# Patient Record
Sex: Female | Born: 2000 | Hispanic: Yes | Marital: Single
Health system: Southern US, Community
[De-identification: ages and names within clinical notes are randomized; demographics above are authoritative.]

---
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
## 2022-01-27 ENCOUNTER — Ambulatory Visit: Payer: Medicaid Other | Attending: Nurse Practitioner | Admitting: Physical Therapy

## 2022-01-27 ENCOUNTER — Other Ambulatory Visit: Payer: Self-pay

## 2022-01-27 DIAGNOSIS — M5442 Lumbago with sciatica, left side: Secondary | ICD-10-CM | POA: Diagnosis present

## 2022-01-27 DIAGNOSIS — G8929 Other chronic pain: Secondary | ICD-10-CM | POA: Diagnosis present

## 2022-01-27 DIAGNOSIS — R262 Difficulty in walking, not elsewhere classified: Secondary | ICD-10-CM

## 2022-01-27 DIAGNOSIS — M79605 Pain in left leg: Secondary | ICD-10-CM
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
## 2022-01-28 ENCOUNTER — Encounter: Payer: Self-pay | Admitting: Physical Therapy

## 2022-02-03 ENCOUNTER — Ambulatory Visit: Payer: Medicaid Other | Admitting: Physical Therapy

## 2022-02-03 ENCOUNTER — Encounter: Payer: Medicaid Other | Admitting: Physical Therapy

## 2022-02-10 ENCOUNTER — Ambulatory Visit: Payer: Medicaid Other | Admitting: Physical Therapy

## 2022-02-10 ENCOUNTER — Encounter: Payer: Self-pay | Admitting: Physical Therapy

## 2022-02-10 DIAGNOSIS — G8929 Other chronic pain: Secondary | ICD-10-CM

## 2022-02-10 DIAGNOSIS — R262 Difficulty in walking, not elsewhere classified: Secondary | ICD-10-CM

## 2022-02-10 DIAGNOSIS — M5442 Lumbago with sciatica, left side: Secondary | ICD-10-CM | POA: Diagnosis not present

## 2022-02-10 DIAGNOSIS — M79605 Pain in left leg: Secondary | ICD-10-CM

## 2022-02-10 NOTE — Therapy (Signed)
?OUTPATIENT PHYSICAL THERAPY TREATMENT NOTE ? ? ?Patient Name: Miranda Watson ?MRN: [id number] ?DOB:02/08/2001, 21 y.o., female ?Today's Date: 02/10/2022 ? ?[facility] ?REFERRING PROVIDER: Sheilah MinsBermudez, Karina E, NP ? ? PT End of Session - 02/10/22 1620   ? ? Visit Number 2   ? Number of Visits 24   ? Date for PT Re-Evaluation 04/21/22   ? Authorization Type self pay reporting period from 01/27/2022   ? Progress Note Due on Visit 10   ? PT Start Time 1617   ? PT Stop Time 1645   ? PT Time Calculation (min) 28 min   ? Activity Tolerance Patient tolerated treatment well   ? [facility] assessed/performed   ? ?  ?  ? ?  ? ? ?History reviewed. No pertinent past medical history. ?History reviewed. No pertinent surgical history. ?There are no problems to display for this patient. ? ? ?REFERRING DIAG: Posterior thigh pain, left ? ?THERAPY DIAG:  ?Chronic left-sided low back pain with left-sided sciatica ? ?Pain in left leg ? ?Difficulty in walking, not elsewhere classified ? ?PERTINENT HISTORY: Patient is a 21 y.o. female who presents to outpatient physical therapy with a referral for medical diagnosis left posterior thigh pain. This patient's chief complaints consist of chronic intermittent pain in the left posterior thigh that radiates at times to the proximal left calf and up to the left low back leading to the following functional deficits: difficulty performing her usual activities as she did prior to onset of condition, cannot touch toes any longer, cannot lift leg farther than a certain angle, has difficulty with stretching, she cannot "do anything," she has trouble with everyday life, walking, sitting down, standing, hanging out with friends. .  Relevant past medical history and comorbidities include low on vitamin D.  Patient denies history of lumbar surgery ? ?PRECAUTIONS: none ? ?SUBJECTIVE: Patient states she is feeling well today and she can tell the stretches from  her HEP are helping some. She states her pain is not bad today. She rates it 2/10 in the familiar location of the posterior left thigh.  ? ?PAIN:  ?Are you having pain? Yes. 2/10 familiar pain in the posterior left thigh.  ? ? ?TODAY'S TREATMENT:  ?Therapeutic exercise: to centralize symptoms and improve ROM, strength, muscular endurance, and activity tolerance required for successful completion of functional activities.  ?- NuStep level 3 using bilateral upper and lower extremities. Seat/handle setting 7/7. For improved extremity mobility, muscular endurance, and activity tolerance; and to induce the analgesic effect of aerobic exercise, stimulate improved joint nutrition, and prepare body structures and systems for following interventions. x 4:40  minutes. Average SPM = 73. Discontinued due to pain increasing to 4-5/10. ?- seated lumbar flexion 2x10 (resolved pain) ?- hooklying posterior pelvic tilt, 1x20 (good carry over)  ?- hooklying posterior pelvic tilt with marching, 1x20 each side  ?- hooklying posterior pelvic tilt with pallof press held against red theraband, 1x20 each side.  ?- seated sciatic nerve glide (slider technique), L LE ?- seated lumbar flexion with self-overpressure , 1x10 ?- Education on HEP including handout  ?  ?  ?Pt required multimodal cuing for proper technique and to facilitate improved neuromuscular control, strength, range of motion, and functional ability resulting in improved performance and form. ?  ? ? ? ?PATIENT EDUCATION: ?Education details: Exercise purpose/form. Self management techniques. ?Person educated: Patient ?Education method: Explanation, Demonstration, Tactile cues, and Verbal cues ?Education comprehension: verbalized understanding, returned  demonstration, verbal cues required, tactile cues required, and needs further education ? ? ?HOME EXERCISE PROGRAM: ?Access Code: [email or web address] ?URL: [email or web address] ?Date: 02/10/2022 ?Prepared by: Norton Blizzard ? ?Exercises ?- Hooklying Anti-Rotation Press With Anchored Resistance  - 1 x daily - 3-5 x weekly - 3 sets - 20 reps ?- Supine Double Knee to Chest  - 2 x daily - 3-5 x weekly - 2 sets - 10 reps - 2 seconds hold ?- Seated Lumbar Flexion Stretch  - 2 x daily - 3-5 x weekly - 2 sets - 10 reps - 2 seconds hold ?- Seated Slump Nerve Glide  - 2 x daily - 1 sets - 15 reps ? ? PT Short Term Goals   ? ?  ? PT SHORT TERM GOAL #1  ? Title Be independent with initial home exercise program for self-management of symptoms.   ? Baseline Initial HEP provided at visit 2 (01/27/2022);   ? Time 2   ? Period Weeks   ? Status Achieved   ? Target Date 02/11/22   ? ?  ?  ? ?  ? ? ? PT Long Term Goals   ?TARGET DATE FOR ALL LONG TERM GOALS: 04/21/2022 ?  ? PT LONG TERM GOAL #1  ? Title Be independent with a long-term home exercise program for self-management of symptoms   ? Baseline Initial HEP provided at IE (01/27/2022);   ? Time 12   ? Period Weeks   ? Status progressing  ?  ? PT LONG TERM GOAL #2  ? Title Reduce pain with functional activities to equal or less than 1/10 to allow patient to complete usual activities including schoolwork, sitting down, walking with less difficulty.   ? Baseline up to 7/10 (01/27/2022);   ? Time 12   ? Period Weeks   ? Status progressing  ?  ? PT LONG TERM GOAL #3  ? Title Demonstrate improved FOTO score by 10 units to demonstrate improvement in overall condition and self-reported functional ability.   ? Baseline to be measured visit 2 as appropriate (01/27/2022); unable due to tech malfunction (02/10/2022);   ? Time 12   ? Period Weeks   ? Status On-going  ?  ? PT LONG TERM GOAL #4  ? Title Have full lumbar spine AROM with no compensations or increase in pain in all planes except intermittent end range discomfort improve ability to complete valued  activities with confidence such as stretching, exercising, everyday activities.   ? Baseline limited and painful - see objective exam (01/27/2022);   ? Time  12   ? Period Weeks   ? Status On-going  ?  ? PT LONG TERM GOAL #5  ? Title Complete community, work and/or recreational activities without limitation due to current condition.   ? Baseline difficulty performing her usual activities including touching toes,  lifting L  leg farther than a certain angle, stretching, doing "anything," everyday life, walking, sitting down, standing, hanging out with friends (01/27/2022);   ? Time 12   ? Period Weeks   ? Status On-going  ? ?  ?  ? ?  ? ? ? Plan  ? ? Clinical Impression Statement Patient arrived late to appointment and her session was short due to limited time. She tolerated treatment well with no increase in pain by end of session. She continues to report resolution of pain with repeated lumbar flexion and feels the core exercises help her. Session focused on updating HEP and advancing core  and pain relieving exercises as tolerated. Patient would benefit from continued management of limiting condition by skilled physical therapist to address remaining impairments and functional limitations to work towards stated goals and return to PLOF or maximal functional independence.  ?  ? Personal Factors and Comorbidities Age;Past/Current Experience;Fitness;Time since onset of injury/illness/exacerbation   ? Examination-Activity Limitations Locomotion Level;Stairs;Carry;Bend;Lift;Squat;Sit   ? Examination-Participation Restrictions Interpersonal Relationship;Occupation;Cleaning;Laundry;Community Activity   difficulty performing her usual activities including touching toes,  lifting L  leg farther than a certain angle, stretching, doing "anything," everyday life, walking, sitting down, standing, hanging out with friends  ? Stability/Clinical Decision Making Stable/Uncomplicated   ? Rehab Potential Good   ? PT Frequency 2x / week   ? PT Duration 12 weeks   ? PT Treatment/Interventions ADLs/Self Care Home Management;Aquatic Therapy;Cryotherapy;Electrical Stimulation;Moist Heat;DME  Instruction;Neuromuscular re-education;Therapeutic exercise;Therapeutic activities;Patient/family education;Manual techniques;Dry needling;Passive range of motion;Spinal Manipulations;Joint Manipulations   ? PT Next

## 2022-02-16 ENCOUNTER — Ambulatory Visit: Payer: Medicaid Other | Admitting: Physical Therapy

## 2022-02-18 ENCOUNTER — Encounter: Payer: Self-pay | Admitting: Physical Therapy

## 2022-02-18 ENCOUNTER — Ambulatory Visit: Payer: Medicaid Other | Attending: Nurse Practitioner | Admitting: Physical Therapy

## 2022-02-18 DIAGNOSIS — M79605 Pain in left leg: Secondary | ICD-10-CM | POA: Insufficient documentation

## 2022-02-18 DIAGNOSIS — M5442 Lumbago with sciatica, left side: Secondary | ICD-10-CM | POA: Insufficient documentation

## 2022-02-18 DIAGNOSIS — G8929 Other chronic pain: Secondary | ICD-10-CM | POA: Diagnosis present

## 2022-02-18 DIAGNOSIS — R262 Difficulty in walking, not elsewhere classified: Secondary | ICD-10-CM | POA: Diagnosis present

## 2022-02-18 DIAGNOSIS — M5459 Other low back pain: Secondary | ICD-10-CM | POA: Diagnosis present

## 2022-02-18 NOTE — Therapy (Signed)
?OUTPATIENT PHYSICAL THERAPY TREATMENT NOTE ? ? ?Patient Name: Miranda Watson ?MRN: [id number] ?DOB:February 06, 2001, 21 y.o., female ?Today's Date: 02/18/2022 ? ?PCP: [facility] ?REFERRING PROVIDER: Sheilah Mins, NP ? ? PT End of Session - 02/18/22 1851   ? ? Visit Number 3   ? Number of Visits 24   ? Date for PT Re-Evaluation 04/21/22   ? Authorization Type [place] [employer]ICA[employer] [place] reporting period from 01/27/2022   ? Authorization Time Period Amerihealthauth#[id number] 3/15-6/7 24 PT visits   ? Authorization - Visit Number 3   ? Authorization - Number of Visits 24   ? Progress Note Due on Visit 10   ? PT Start Time 1815   ? PT Stop Time 1855   ? PT Time Calculation (min) 40 min   ? Activity Tolerance Patient tolerated treatment well   ? Behavior During Therapy [facility] for tasks assessed/performed   ? ?  ?  ? ?  ? ? ? ?History reviewed. No pertinent past medical history. ?History reviewed. No pertinent surgical history. ?There are no problems to display for this patient. ? ? ?REFERRING DIAG: Posterior thigh pain, left ? ?THERAPY DIAG:  ?Chronic left-sided low back pain with left-sided sciatica ? ?Pain in left leg ? ?Difficulty in walking, not elsewhere classified ? ?PERTINENT HISTORY: Patient is a 21 y.o. female who presents to outpatient physical therapy with a referral for medical diagnosis left posterior thigh pain. This patient's chief complaints consist of chronic intermittent pain in the left posterior thigh that radiates at times to the proximal left calf and up to the left low back leading to the following functional deficits: difficulty performing her usual activities as she did prior to onset of condition, cannot touch toes any longer, cannot lift leg farther than a certain angle, has difficulty with stretching, she cannot "do anything," she has trouble with everyday life, walking, sitting down, standing, hanging out with friends. .  Relevant past medical history and  comorbidities include low on vitamin D.  Patient denies history of lumbar surgery ? ?PRECAUTIONS: none ? ?SUBJECTIVE: Patient reports she is feeling well and her leg pain has been better. She states she has days now where she doesn't have leg pain at all. She states today the pain has been off and on but currently has no pain. She states she has had a hard time getting her HEP done because she has been busy.  ? ?PAIN:  ?Are you having pain? No.  ? ?OBJECTIVE ? ?SELF-REPORTED FUNCTION ?FOTO score: 44/100 (lumbar questionnaire) ? ?TODAY'S TREATMENT:  ?Therapeutic exercise: to centralize symptoms and improve ROM, strength, muscular endurance, and activity tolerance required for successful completion of functional activities.  ?- NuStep level 4 using bilateral upper and lower extremities. Seat/handle setting 7/7. For improved extremity mobility, muscular endurance, and activity tolerance; and to induce the analgesic effect of aerobic exercise, stimulate improved joint nutrition, and prepare body structures and systems for following interventions. x 5:48  minutes. Average SPM = 77. ?- seated lumbar flexion 2x10 with self overpressure ?- hooklying posterior pelvic tilt with marches while holding pallof press held against green theraband, 1x20 each side.  ?- hooklying posterior pelvic tilt with alternating LE extension while holding pallof press held against green theraband, 2x1 min each side.  ?- hooklying articulated bridge, 3x10  ?- seated sciatic nerve glide (slider technique), 1x15 L LE ?- seated hamstring curl on OMEGA machine, 3x10 at 20# ?- seated lumbar flexion with self-overpressure, 2x10 ? ?  Pt required multimodal cuing for proper technique and to facilitate improved neuromuscular control, strength, range of motion, and functional ability resulting in improved performance and form. ?  ? ?PATIENT EDUCATION: ?Education details: Exercise purpose/form. Self management techniques. ?Person educated: Patient ?Education  method: Explanation, Demonstration, Tactile cues, and Verbal cues ?Education comprehension: verbalized understanding, returned demonstration, verbal cues required, tactile cues required, and needs further education ? ? ?HOME EXERCISE PROGRAM: ?Access Code: [email or web address] ?URL: [email or web address] ?Date: 02/10/2022 ?Prepared by: Norton BlizzardSara Tiler Brandis ? ?Exercises ?- Hooklying Anti-Rotation Press With Anchored Resistance  - 1 x daily - 3-5 x weekly - 3 sets - 20 reps ?- Supine Double Knee to Chest  - 2 x daily - 3-5 x weekly - 2 sets - 10 reps - 2 seconds hold ?- Seated Lumbar Flexion Stretch  - 2 x daily - 3-5 x weekly - 2 sets - 10 reps - 2 seconds hold ?- Seated Slump Nerve Glide  - 2 x daily - 1 sets - 15 reps ? ? PT Short Term Goals   ? ?  ? PT SHORT TERM GOAL #1  ? Title Be independent with initial home exercise program for self-management of symptoms.   ? Baseline Initial HEP provided at visit 2 (01/27/2022);   ? Time 2   ? Period Weeks   ? Status Achieved   ? Target Date 02/11/22   ? ?  ?  ? ?  ? ? ? PT Long Term Goals   ?TARGET DATE FOR ALL LONG TERM GOALS: 04/21/2022 ?  ? PT LONG TERM GOAL #1  ? Title Be independent with a long-term home exercise program for self-management of symptoms   ? Baseline Initial HEP provided at IE (01/27/2022);   ? Time 12   ? Period Weeks   ? Status progressing  ?  ? PT LONG TERM GOAL #2  ? Title Reduce pain with functional activities to equal or less than 1/10 to allow patient to complete usual activities including schoolwork, sitting down, walking with less difficulty.   ? Baseline up to 7/10 (01/27/2022);   ? Time 12   ? Period Weeks   ? Status progressing  ?  ? PT LONG TERM GOAL #3  ? Title Demonstrate improved FOTO score by 10 units to demonstrate improvement in overall condition and self-reported functional ability.   ? Baseline to be measured visit 2 as appropriate (01/27/2022); 44 at visit#2 (02/10/2022);   ? Time 12   ? Period Weeks   ? Status On-going  ?  ? PT LONG TERM GOAL  #4  ? Title Have full lumbar spine AROM with no compensations or increase in pain in all planes except intermittent end range discomfort improve ability to complete valued  activities with confidence such as stretching, exercising, everyday activities.   ? Baseline limited and painful - see objective exam (01/27/2022);   ? Time 12   ? Period Weeks   ? Status On-going  ?  ? PT LONG TERM GOAL #5  ? Title Complete community, work and/or recreational activities without limitation due to current condition.   ? Baseline difficulty performing her usual activities including touching toes,  lifting L  leg farther than a certain angle, stretching, doing "anything," everyday life, walking, sitting down, standing, hanging out with friends (01/27/2022);   ? Time 12   ? Period Weeks   ? Status On-going  ? ?  ?  ? ?  ? ? ? Plan  ? ? Clinical Impression Statement  Patient tolerated treatment well with no increase in symptoms by end of session. Continued working on flexion based core strengthening and specific exercise for flexion preference. Plan to continue to progress exercises to work towards more functional positions as appropriate next session. Patient would benefit from continued management of limiting condition by skilled physical therapist to address remaining impairments and functional limitations to work towards stated goals and return to PLOF or maximal functional independence.   ? Personal Factors and Comorbidities Age;Past/Current Experience;Fitness;Time since onset of injury/illness/exacerbation   ? Examination-Activity Limitations Locomotion Level;Stairs;Carry;Bend;Lift;Squat;Sit   ? Examination-Participation Restrictions Interpersonal Relationship;Occupation;Cleaning;Laundry;Community Activity   difficulty performing her usual activities including touching toes,  lifting L  leg farther than a certain angle, stretching, doing "anything," everyday life, walking, sitting down, standing, hanging out with friends  ?  Stability/Clinical Decision Making Stable/Uncomplicated   ? Rehab Potential Good   ? PT Frequency 2x / week   ? PT Duration 12 weeks   ? PT Treatment/Interventions ADLs/Self Care Home Management;Aquatic Therapy;[place]

## 2022-02-23 ENCOUNTER — Ambulatory Visit: Payer: Medicaid Other | Admitting: Physical Therapy

## 2022-02-24 ENCOUNTER — Telehealth: Payer: Self-pay | Admitting: Physical Therapy

## 2022-02-24 NOTE — Telephone Encounter (Signed)
Called patient after she did not come to her appointment scheduled yesterday at 5:30pm. She answered and was very apologetic when told about the missed appointment, stating she completely forgot. She knew when her next appointment is scheduled (thurs 02/25/22 at 6:15pm) and confirmed she planned to be there.  ? ?Everlean Alstrom. Graylon Good, PT, DPT ?02/24/22, 9:17 AM ? ?[facility] ?[address] ?[place], [place] [address] ?PFC:[id number] I F: [phone] ? ?

## 2022-02-25 ENCOUNTER — Encounter: Payer: Self-pay | Admitting: Physical Therapy

## 2022-02-25 ENCOUNTER — Ambulatory Visit: Payer: Medicaid Other | Admitting: Physical Therapy

## 2022-02-25 DIAGNOSIS — M5442 Lumbago with sciatica, left side: Secondary | ICD-10-CM | POA: Diagnosis not present

## 2022-02-25 DIAGNOSIS — R262 Difficulty in walking, not elsewhere classified: Secondary | ICD-10-CM

## 2022-02-25 DIAGNOSIS — M79605 Pain in left leg: Secondary | ICD-10-CM

## 2022-02-25 DIAGNOSIS — M5459 Other low back pain: Secondary | ICD-10-CM

## 2022-02-25 NOTE — Therapy (Signed)
?OUTPATIENT PHYSICAL THERAPY TREATMENT NOTE ? ? ?Patient Name: Miranda Watson ?MRN: [id number] ?DOB:[birth year], 21 y.o., female ?Today's Date: 02/25/2022 ? ?PCP: [facility] ?REFERRING PROVIDER: Sheilah Mins, NP ? ? PT End of Session - 02/25/22 1827   ? ? Visit Number 4   ? Number of Visits 24   ? Date for PT Re-Evaluation 04/21/22   ? Authorization Type [place] MEDICAID [employer] OF [place] reporting period from 01/27/2022   ? Authorization Time Period Amerihealthauth#[id number] 3/15-6/7 24 PT visits   ? Authorization - Visit Number 4   ? Authorization - Number of Visits 24   ? Progress Note Due on Visit 10   ? PT Start Time 1823   ? PT Stop Time 1901   ? PT Time Calculation (min) 38 min   ? Activity Tolerance Patient tolerated treatment well   ? Behavior During Therapy [facility] for tasks assessed/performed   ? ?  ?  ? ?  ? ? ? ? ?History reviewed. No pertinent past medical history. ?History reviewed. No pertinent surgical history. ?There are no problems to display for this patient. ? ? ?REFERRING DIAG: Posterior thigh pain, left ? ?THERAPY DIAG:  ?Other low back pain ? ?Pain in left leg ? ?Difficulty in walking, not elsewhere classified ? ?PERTINENT HISTORY: Patient is a 21 y.o. female who presents to outpatient physical therapy with a referral for medical diagnosis left posterior thigh pain. This patient's chief complaints consist of chronic intermittent pain in the left posterior thigh that radiates at times to the proximal left calf and up to the left low back leading to the following functional deficits: difficulty performing her usual activities as she did prior to onset of condition, cannot touch toes any longer, cannot lift leg farther than a certain angle, has difficulty with stretching, she cannot "do anything," she has trouble with everyday life, walking, sitting down, standing, hanging out with friends. .  Relevant past medical history and comorbidities include low on vitamin D.   Patient denies history of lumbar surgery ? ?PRECAUTIONS: none ? ?SUBJECTIVE: Patient states she is feeling well and has no pain upon arrival. Her left leg has been feeling better since last PT session with less and less pain but it has been hurting on and off up to 4/10 pain today for some reason. She has been doing a lot of driving lately. Yesterday she did not do much and today she went to the park and painted on a canvas, which required a lot of sitting and some walking. HEP has been going well. She has been able to do all of them since it is spring break.  ? ?PAIN:  ?Are you having pain? No.  ? ?OBJECTIVE ? ?TODAY'S TREATMENT:  ?Therapeutic exercise: to centralize symptoms and improve ROM, strength, muscular endurance, and activity tolerance required for successful completion of functional activities.  ?- NuStep level 4 using bilateral upper and lower extremities. Seat/handle setting 7/7. For improved extremity mobility, muscular endurance, and activity tolerance; and to induce the analgesic effect of aerobic exercise, stimulate improved joint nutrition, and prepare body structures and systems for following interventions. x 5 minutes. Average SPM = 80. ?- seated lumbar flexion 1x10 with self overpressure on chair. ?- seated lumbar flexion 1x10 with self overpressure on 8.5 inch stool.  ?- seated hamstring curl on OMEGA machine, 1x30 at 25# and 1x15 at 35# ?- hooklying posterior pelvic tilt with alternating march/LE extension while holding pallof press held against blue theraband, 1x10  each side. ?- hooklying with feet elevated, PPT with marching up to reverse curl hold briefly, 3x10 (other variations of dead bug and reverse curl practiced first and too difficult).   ?- seated lumbar flexion with self-overpressure at 8.5 inch stool, 3x10 (one completed after first set of reverse curl exercise due to starting to develop leg pain at that time.  ? ?Pt required multimodal cuing for proper technique and to facilitate  improved neuromuscular control, strength, range of motion, and functional ability resulting in improved performance and form. ?  ? ?PATIENT EDUCATION: ?Education details: Exercise purpose/form. Self management techniques. ?Person educated: Patient ?Education method: Explanation, Demonstration, Tactile cues, and Verbal cues ?Education comprehension: verbalized understanding, returned demonstration, verbal cues required, tactile cues required, and needs further education ? ? ?HOME EXERCISE PROGRAM: ?Access Code: [email or web address] ?URL: [email or web address] ?Date: 02/25/2022 ?Prepared by: Norton BlizzardSara Jazzalyn Loewenstein ? ?Exercises ?- Hooklying Anti-Rotation Press With Anchored Resistance  - 1 x daily - 3-5 x weekly - 3 sets - 20 reps ?- Supine Double Knee to Chest  - 2 x daily - 3-5 x weekly - 2 sets - 10 reps - 2 seconds hold ?- Seated Lumbar Flexion Stretch  - 2 x daily - 3-5 x weekly - 2 sets - 10 reps - 2 seconds hold ?- Seated Slump Nerve Glide  - 2 x daily - 1 sets - 15 reps ?- Reverse Curl  - 3-5 x weekly - 3 sets - 10 reps ? ? PT Short Term Goals   ? ?  ? PT SHORT TERM GOAL #1  ? Title Be independent with initial home exercise program for self-management of symptoms.   ? Baseline Initial HEP provided at visit 2 (01/27/2022);   ? Time 2   ? Period Weeks   ? Status Achieved   ? Target Date 02/11/22   ? ?  ?  ? ?  ? ? ? PT Long Term Goals   ?TARGET DATE FOR ALL LONG TERM GOALS: 04/21/2022 ?  ? PT LONG TERM GOAL #1  ? Title Be independent with a long-term home exercise program for self-management of symptoms   ? Baseline Initial HEP provided at IE (01/27/2022);   ? Time 12   ? Period Weeks   ? Status progressing  ?  ? PT LONG TERM GOAL #2  ? Title Reduce pain with functional activities to equal or less than 1/10 to allow patient to complete usual activities including schoolwork, sitting down, walking with less difficulty.   ? Baseline up to 7/10 (01/27/2022);   ? Time 12   ? Period Weeks   ? Status progressing  ?  ? PT LONG TERM  GOAL #3  ? Title Demonstrate improved FOTO score by 10 units to demonstrate improvement in overall condition and self-reported functional ability.   ? Baseline to be measured visit 2 as appropriate (01/27/2022); 44 at visit#2 (02/10/2022);   ? Time 12   ? Period Weeks   ? Status On-going  ?  ? PT LONG TERM GOAL #4  ? Title Have full lumbar spine AROM with no compensations or increase in pain in all planes except intermittent end range discomfort improve ability to complete valued  activities with confidence such as stretching, exercising, everyday activities.   ? Baseline limited and painful - see objective exam (01/27/2022);   ? Time 12   ? Period Weeks   ? Status On-going  ?  ? PT LONG TERM GOAL #5  ? Title Complete community, work and/or  recreational activities without limitation due to current condition.   ? Baseline difficulty performing her usual activities including touching toes,  lifting L  leg farther than a certain angle, stretching, doing "anything," everyday life, walking, sitting down, standing, hanging out with friends (01/27/2022);   ? Time 12   ? Period Weeks   ? Status On-going  ? ?  ?  ? ?  ? ? ? Plan  ? ? Clinical Impression Statement Patient tolerated treatment well and continues to demonstrate good response with centralization and abolishment of pain with  flexion exercises. Progressed abdominal/core strengthening with updated HEP. Patient is very challenged by lifting both feet of the floor while keeping posterior pelvic tilt. Plan to continue with progessive core exercises next session with progressions to functional positions as tolerated. Patient would benefit from continued management of limiting condition by skilled physical therapist to address remaining impairments and functional limitations to work towards stated goals and return to PLOF or maximal functional independence.  ?  ? Personal Factors and Comorbidities Age;Past/Current Experience;Fitness;Time since onset of  injury/illness/exacerbation   ? Examination-Activity Limitations Locomotion Level;Stairs;Carry;Bend;Lift;Squat;Sit   ? Examination-Participation Restrictions Interpersonal Relationship;Occupation;Cleaning;Laundry;Community Act

## 2022-03-02 ENCOUNTER — Encounter: Payer: Self-pay | Admitting: Physical Therapy

## 2022-03-02 ENCOUNTER — Ambulatory Visit: Payer: Medicaid Other | Admitting: Physical Therapy

## 2022-03-02 DIAGNOSIS — R262 Difficulty in walking, not elsewhere classified: Secondary | ICD-10-CM

## 2022-03-02 DIAGNOSIS — M5442 Lumbago with sciatica, left side: Secondary | ICD-10-CM | POA: Diagnosis not present

## 2022-03-02 DIAGNOSIS — M5459 Other low back pain: Secondary | ICD-10-CM

## 2022-03-02 DIAGNOSIS — M79605 Pain in left leg: Secondary | ICD-10-CM

## 2022-03-02 NOTE — Therapy (Signed)
?OUTPATIENT PHYSICAL THERAPY TREATMENT NOTE ? ? ?Patient Name: Miranda Watson ?MRN: [id number] ?DOB:November 29, 2000, 21 y.o., female ?Today's Date: 03/02/2022 ? ?PCP: [facility] ?REFERRING PROVIDER: Sheilah Mins, NP ? ? PT End of Session - 03/02/22 1804   ? ? Visit Number 5   ? Number of Visits 24   ? Date for PT Re-Evaluation 04/21/22   ? Authorization Type [place] [employer]ICA[employer] [place] reporting period from 01/27/2022   ? Authorization Time Period Amerihealthauth#[id number] 3/15-6/7 24 PT visits   ? Authorization - Visit Number 5   ? Authorization - Number of Visits 24   ? Progress Note Due on Visit 10   ? PT Start Time 1735   ? PT Stop Time 1815   ? PT Time Calculation (min) 40 min   ? Activity Tolerance Patient tolerated treatment well   ? Behavior During Therapy [facility] for tasks assessed/performed   ? ?  ?  ? ?  ? ? ? ? ? ?History reviewed. No pertinent past medical history. ?History reviewed. No pertinent surgical history. ?There are no problems to display for this patient. ? ? ?REFERRING DIAG: Posterior thigh pain, left ? ?THERAPY DIAG:  ?Other low back pain ? ?Pain in left leg ? ?Difficulty in walking, not elsewhere classified ? ?PERTINENT HISTORY: Patient is a 20 y.o. female who presents to outpatient physical therapy with a referral for medical diagnosis left posterior thigh pain. This patient's chief complaints consist of chronic intermittent pain in the left posterior thigh that radiates at times to the proximal left calf and up to the left low back leading to the following functional deficits: difficulty performing her usual activities as she did prior to onset of condition, cannot touch toes any longer, cannot lift leg farther than a certain angle, has difficulty with stretching, she cannot "do anything," she has trouble with everyday life, walking, sitting down, standing, hanging out with friends. .  Relevant past medical history and comorbidities include low on vitamin D.   Patient denies history of lumbar surgery. ? ?PRECAUTIONS: none ? ?SUBJECTIVE: Patient reports she is feeling well and has no pain upon arrival. She did have some pain earlier today when taking a nap. The pain was up to 5/10. She did the seated flexion stretch and it got better. She states she worked on Saturday for about 5 hours and her leg did not bother her. She states her leg was fine after a 7 hour shift she did about 2 weeks ago. She states she felt good after last PT session. HEP continues to go well. She was painting at the park earlier today.  ? ?PAIN:  ?Are you having pain? No.  ? ?OBJECTIVE ? ?TODAY'S TREATMENT:  ?Therapeutic exercise: to centralize symptoms and improve ROM, strength, muscular endurance, and activity tolerance required for successful completion of functional activities.  ?- NuStep level 5 using bilateral upper and lower extremities. Seat/handle setting 7/7. For improved extremity mobility, muscular endurance, and activity tolerance; and to induce the analgesic effect of aerobic exercise, stimulate improved joint nutrition, and prepare body structures and systems for following interventions. x 5 minutes. Average SPM = 88. ?- seated lumbar flexion 1x10 with self overpressure on 8.5 inch stool.  ?- seated hamstring curl on OMEGA machine, 3x15/15/10 at 35/45/45# ?- seated slow marching on theraball while holding blue theraband in multifidus press position. 3x1 min each side. green theraball. ?- hooklying with feet elevated, PPT with marching up to reverse curl hold briefly, 2x10 (  knees brought closer to chest to find appropriate challenge level).    ? ?Pt required multimodal cuing for proper technique and to facilitate improved neuromuscular control, strength, range of motion, and functional ability resulting in improved performance and form. ?  ? ?PATIENT EDUCATION: ?Education details: Exercise purpose/form. Self management techniques. ?Person educated: Patient ?Education method: Explanation,  Demonstration, Tactile cues, and Verbal cues ?Education comprehension: verbalized understanding, returned demonstration, verbal cues required, tactile cues required, and needs further education ? ? ?HOME EXERCISE PROGRAM: ?Access Code: [email or web address] ?URL: [email or web address] ?Date: 02/25/2022 ?Prepared by: Norton Blizzard ? ?Exercises ?- Hooklying Anti-Rotation Press With Anchored Resistance  - 1 x daily - 3-5 x weekly - 3 sets - 20 reps ?- Supine Double Knee to Chest  - 2 x daily - 3-5 x weekly - 2 sets - 10 reps - 2 seconds hold ?- Seated Lumbar Flexion Stretch  - 2 x daily - 3-5 x weekly - 2 sets - 10 reps - 2 seconds hold ?- Seated Slump Nerve Glide  - 2 x daily - 1 sets - 15 reps ?- Reverse Curl  - 3-5 x weekly - 3 sets - 10 reps ? ? PT Short Term Goals   ? ?  ? PT SHORT TERM GOAL #1  ? Title Be independent with initial home exercise program for self-management of symptoms.   ? Baseline Initial HEP provided at visit 2 (01/27/2022);   ? Time 2   ? Period Weeks   ? Status Achieved   ? Target Date 02/11/22   ? ?  ?  ? ?  ? ? ? PT Long Term Goals   ?TARGET DATE FOR ALL LONG TERM GOALS: 04/21/2022 ?  ? PT LONG TERM GOAL #1  ? Title Be independent with a long-term home exercise program for self-management of symptoms   ? Baseline Initial HEP provided at IE (01/27/2022);   ? Time 12   ? Period Weeks   ? Status progressing  ?  ? PT LONG TERM GOAL #2  ? Title Reduce pain with functional activities to equal or less than 1/10 to allow patient to complete usual activities including schoolwork, sitting down, walking with less difficulty.   ? Baseline up to 7/10 (01/27/2022);   ? Time 12   ? Period Weeks   ? Status progressing  ?  ? PT LONG TERM GOAL #3  ? Title Demonstrate improved FOTO score by 10 units to demonstrate improvement in overall condition and self-reported functional ability.   ? Baseline to be measured visit 2 as appropriate (01/27/2022); 44 at visit#2 (02/10/2022);   ? Time 12   ? Period Weeks   ? Status  On-going  ?  ? PT LONG TERM GOAL #4  ? Title Have full lumbar spine AROM with no compensations or increase in pain in all planes except intermittent end range discomfort improve ability to complete valued  activities with confidence such as stretching, exercising, everyday activities.   ? Baseline limited and painful - see objective exam (01/27/2022);   ? Time 12   ? Period Weeks   ? Status On-going  ?  ? PT LONG TERM GOAL #5  ? Title Complete community, work and/or recreational activities without limitation due to current condition.   ? Baseline difficulty performing her usual activities including touching toes,  lifting L  leg farther than a certain angle, stretching, doing "anything," everyday life, walking, sitting down, standing, hanging out with friends (01/27/2022);   ? Time 12   ?  Period Weeks   ? Status On-going  ? ?  ?  ? ?  ? ? ? Plan  ? ? Clinical Impression Statement Patient tolerated treatment well with no increase in pain by end of session. She continues to progress well with core stability and sympotm control. Plan to continue with core strengthening and specific exercise for lumbar flexion as appropriate next session. Patient would benefit from continued management of limiting condition by skilled physical therapist to address remaining impairments and functional limitations to work towards stated goals and return to PLOF or maximal functional independence.  ?  ? Personal Factors and Comorbidities Age;Past/Current Experience;Fitness;Time since onset of injury/illness/exacerbation   ? Examination-Activity Limitations Locomotion Level;Stairs;Carry;Bend;Lift;Squat;Sit   ? Examination-Participation Restrictions Interpersonal Relationship;Occupation;Cleaning;Laundry;Community Activity   difficulty performing her usual activities including touching toes,  lifting L  leg farther than a certain angle, stretching, doing "anything," everyday life, walking, sitting down, standing, hanging out with friends  ?  Stability/Clinical Decision Making Stable/Uncomplicated   ? Rehab Potential Good   ? PT Frequency 2x / week   ? PT Duration 12 weeks   ? PT Treatment/Interventions ADLs/Self Care Home Management;Aquatic Ther

## 2022-03-04 ENCOUNTER — Ambulatory Visit: Payer: Medicaid Other | Admitting: Physical Therapy

## 2022-03-09 ENCOUNTER — Ambulatory Visit: Payer: Medicaid Other | Admitting: Physical Therapy

## 2022-03-09 ENCOUNTER — Encounter: Payer: Self-pay | Admitting: Physical Therapy

## 2022-03-09 DIAGNOSIS — M5442 Lumbago with sciatica, left side: Secondary | ICD-10-CM | POA: Diagnosis not present

## 2022-03-09 DIAGNOSIS — R262 Difficulty in walking, not elsewhere classified: Secondary | ICD-10-CM

## 2022-03-09 DIAGNOSIS — M5459 Other low back pain: Secondary | ICD-10-CM

## 2022-03-09 DIAGNOSIS — M79605 Pain in left leg: Secondary | ICD-10-CM

## 2022-03-09 NOTE — Therapy (Signed)
?OUTPATIENT PHYSICAL THERAPY TREATMENT NOTE ? ? ?Patient Name: Miranda Watson ?MRN: [id number] ?DOB:09/16/2001, 21 y.o., female ?Today's Date: 03/09/2022 ? ?[facility] ?REFERRING PROVIDER: Sheilah MinsBermudez, Karina E, NP ? ? PT End of Session - 03/09/22 1825   ? ? Visit Number 6   ? Number of Visits 24   ? Date for PT Re-Evaluation 04/21/22   ? Authorization Type [place] [employer]ICA[employer] OF  reporting period from 01/27/2022   ? Authorization Time Period Amerihealthauth#[id number] 3/15-6/7 24 PT visits   ? Authorization - Visit Number 6   ? Authorization - Number of Visits 24   ? Progress Note Due on Visit 10   ? PT Start Time 1820   ? PT Stop Time 1900   ? PT Time Calculation (min) 40 min   ? Activity Tolerance Patient tolerated treatment well   ? Behavior [facility] for tasks assessed/performed   ? ?  ?  ? ?  ? ? ? ? ? ? ?History reviewed. No pertinent past medical history. ?History reviewed. No pertinent surgical history. ?There are no problems to display for this patient. ? ? ?REFERRING DIAG: Posterior thigh pain, left ? ?THERAPY DIAG:  ?Other low back pain ? ?Pain in left leg ? ?Difficulty in walking, not elsewhere classified ? ?PERTINENT HISTORY: Patient is a 21 y.o. female who presents to outpatient physical therapy with a referral for medical diagnosis left posterior thigh pain. This patient's chief complaints consist of chronic intermittent pain in the left posterior thigh that radiates at times to the proximal left calf and up to the left low back leading to the following functional deficits: difficulty performing her usual activities as she did prior to onset of condition, cannot touch toes any longer, cannot lift leg farther than a certain angle, has difficulty with stretching, she cannot "do anything," she has trouble with everyday life, walking, sitting down, standing, hanging out with friends. .  Relevant past medical history and comorbidities include low on vitamin D.   Patient denies history of lumbar surgery. ? ?PRECAUTIONS: none ? ?SUBJECTIVE: Patient states she is feeling well today and has not had pain in her left thigh except when she stood for 6 hours at a concert in [place] on Sunday. Her pain went away on the car ride home. She currently has no pain.  ? ?PAIN:  ?Are you having pain? No.  ? ?OBJECTIVE ? ?SELF-REPORTED FUNCTION ?FOTO score: 42/100 (lumbar spine questionnaire) ? ? ?TODAY'S TREATMENT:  ?Therapeutic exercise: to centralize symptoms and improve ROM, strength, muscular endurance, and activity tolerance required for successful completion of functional activities.  ?- NuStep level 5 using bilateral upper and lower extremities. Seat/handle setting 7/7. For improved extremity mobility, muscular endurance, and activity tolerance; and to induce the analgesic effect of aerobic exercise, stimulate improved joint nutrition, and prepare body structures and systems for following interventions. x 5 minutes. Average SPM = 90. ?- seated hamstring curl on OMEGA machine, 3x15 at 45# ?- seated slow marching on theraball while holding blue theraband in multifidus press position. 3x1 min each side. green theraball. ?- hooklying with feet elevated, PPT with marching up to reverse curl hold briefly, 2x10 (improved control since last visit) ?- sit <> stand with 4#DB extended in front of body in both hands. 1 x10 ?- squat with buttocks touch on 18.5 inch plinth with 4#DB extended in front of body in both hands. 2 x10 ? ?Pt required multimodal cuing for proper technique and to facilitate  improved neuromuscular control, strength, range of motion, and functional ability resulting in improved performance and form. ?  ? ?PATIENT EDUCATION: ?Education details: Exercise purpose/form. Self management techniques. ?Person educated: Patient ?Education method: Explanation, Demonstration, Tactile cues, and Verbal cues ?Education comprehension: verbalized understanding, returned demonstration,  verbal cues required, tactile cues required, and needs further education ? ? ?HOME EXERCISE PROGRAM: ?Access Code: [email or web address] ?URL: [email or web address] ?Date: 02/25/2022 ?Prepared by: Norton Blizzard ? ?Exercises ?- Hooklying Anti-Rotation Press With Anchored Resistance  - 1 x daily - 3-5 x weekly - 3 sets - 20 reps ?- Supine Double Knee to Chest  - 2 x daily - 3-5 x weekly - 2 sets - 10 reps - 2 seconds hold ?- Seated Lumbar Flexion Stretch  - 2 x daily - 3-5 x weekly - 2 sets - 10 reps - 2 seconds hold ?- Seated Slump Nerve Glide  - 2 x daily - 1 sets - 15 reps ?- Reverse Curl  - 3-5 x weekly - 3 sets - 10 reps ? ? PT Short Term Goals   ? ?  ? PT SHORT TERM GOAL #1  ? Title Be independent with initial home exercise program for self-management of symptoms.   ? Baseline Initial HEP provided at visit 2 (01/27/2022);   ? Time 2   ? Period Weeks   ? Status Achieved   ? Target Date 02/11/22   ? ?  ?  ? ?  ? ? ? PT Long Term Goals   ?TARGET DATE FOR ALL LONG TERM GOALS: 04/21/2022 ?  ? PT LONG TERM GOAL #1  ? Title Be independent with a long-term home exercise program for self-management of symptoms   ? Baseline Initial HEP provided at IE (01/27/2022);   ? Time 12   ? Period Weeks   ? Status progressing  ?  ? PT LONG TERM GOAL #2  ? Title Reduce pain with functional activities to equal or less than 1/10 to allow patient to complete usual activities including schoolwork, sitting down, walking with less difficulty.   ? Baseline up to 7/10 (01/27/2022);   ? Time 12   ? Period Weeks   ? Status progressing  ?  ? PT LONG TERM GOAL #3  ? Title Demonstrate improved FOTO score by 10 units to demonstrate improvement in overall condition and self-reported functional ability.   ? Baseline to be measured visit 2 as appropriate (01/27/2022); 44 at visit#2 (02/10/2022); 42 at visit#6 (03/09/2022);  ? Time 12   ? Period Weeks   ? Status On-going  ?  ? PT LONG TERM GOAL #4  ? Title Have full lumbar spine AROM with no compensations or  increase in pain in all planes except intermittent end range discomfort improve ability to complete valued  activities with confidence such as stretching, exercising, everyday activities.   ? Baseline limited and painful - see objective exam (01/27/2022);   ? Time 12   ? Period Weeks   ? Status On-going  ?  ? PT LONG TERM GOAL #5  ? Title Complete community, work and/or recreational activities without limitation due to current condition.   ? Baseline difficulty performing her usual activities including touching toes,  lifting L  leg farther than a certain angle, stretching, doing "anything," everyday life, walking, sitting down, standing, hanging out with friends (01/27/2022);   ? Time 12   ? Period Weeks   ? Status On-going  ? ?  ?  ? ?  ? ? ?  Plan  ? ? Clinical Impression Statement Patient tolerated treatment well with no symptoms throughout session. Progressed exercises as appropriate. Plan to consider discharge next session due to improvement in symptoms. Patient would benefit from continued management of limiting condition by skilled physical therapist to address remaining impairments and functional limitations to work towards stated goals and return to PLOF or maximal functional independence.   ? Personal Factors and Comorbidities Age;Past/Current Experience;Fitness;Time since onset of injury/illness/exacerbation   ? Examination-Activity Limitations Locomotion Level;Stairs;Carry;Bend;Lift;Squat;Sit   ? Examination-Participation Restrictions Interpersonal Relationship;Occupation;Cleaning;Laundry;Community Activity   difficulty performing her usual activities including touching toes,  lifting L  leg farther than a certain angle, stretching, doing "anything," everyday life, walking, sitting down, standing, hanging out with friends  ? Stability/Clinical Decision Making Stable/Uncomplicated   ? Rehab Potential Good   ? PT Frequency 2x / week   ? PT Duration 12 weeks   ? PT Treatment/Interventions ADLs/Self Care Home  Management;Aquatic Therapy;Cryotherapy;Electrical Stimulation;Moist Heat;DME Instruction;Neuromuscular re-education;Therapeutic exercise;Therapeutic activities;Patient/family education;Manual techniques;

## 2022-03-17 ENCOUNTER — Encounter: Payer: Self-pay | Admitting: Physical Therapy

## 2022-03-17 ENCOUNTER — Ambulatory Visit: Payer: Medicaid Other | Attending: Nurse Practitioner | Admitting: Physical Therapy

## 2022-03-17 DIAGNOSIS — M5459 Other low back pain: Secondary | ICD-10-CM | POA: Insufficient documentation

## 2022-03-17 DIAGNOSIS — R262 Difficulty in walking, not elsewhere classified: Secondary | ICD-10-CM | POA: Diagnosis present

## 2022-03-17 DIAGNOSIS — M79605 Pain in left leg: Secondary | ICD-10-CM | POA: Diagnosis present

## 2022-03-17 NOTE — Therapy (Signed)
?OUTPATIENT PHYSICAL THERAPY TREATMENT NOTE / DISCHARGE SUMMARY ?Dates of reporting from 01/27/2022 to 03/17/2022 ? ? ?Patient Name: Miranda Watson ?MRN: [id number] ?DOB:[birth year], 21 y.o., female ?Today's Date: 03/17/2022 ? ?PCP: [facility] ?REFERRING PROVIDER: Elpidio Anis, NP ? ? PT End of Session - 03/17/22 1942   ? ? Visit Number 7   ? Number of Visits 24   ? Date for PT Re-Evaluation 04/21/22   ? Authorization Type [place] [employer] [place] reporting period from 01/27/2022   ? Authorization Time Period [employer]auth#[id number] 3/15-6/7 24 PT visits   ? Authorization - Visit Number 7   ? Authorization - Number of Visits 24   ? Progress Note Due on Visit 10   ? PT Start Time 1904   ? PT Stop Time 1942   ? PT Time Calculation (min) 38 min   ? Activity Tolerance Patient tolerated treatment well   ? Behavior During Therapy [facility] for tasks assessed/performed   ? ?  ?  ? ?  ? ? ? ? ? ? ? ?History reviewed. No pertinent past medical history. ?History reviewed. No pertinent surgical history. ?There are no problems to display for this patient. ? ? ?REFERRING DIAG: Posterior thigh pain, left ? ?THERAPY DIAG:  ?Other low back pain ? ?Pain in left leg ? ?Difficulty in walking, not elsewhere classified ? ?PERTINENT HISTORY: Patient is a 21 y.o. female who presents to outpatient physical therapy with a referral for medical diagnosis left posterior thigh pain. This patient's chief complaints consist of chronic intermittent pain in the left posterior thigh that radiates at times to the proximal left calf and up to the left low back leading to the following functional deficits: difficulty performing her usual activities as she did prior to onset of condition, cannot touch toes any longer, cannot lift leg farther than a certain angle, has difficulty with stretching, she cannot "do anything," she has trouble with everyday life, walking, sitting down, standing, hanging out with friends. .   Relevant past medical history and comorbidities include low on vitamin D.  Patient denies history of lumbar surgery. ? ?PRECAUTIONS: none ? ?SUBJECTIVE: Patient reports she is feeling well today and has no pain. The last time she had pain was briefly yesterday and she did her flexion stretches and it went away. She feels comfortable discharging from PT today. She got a gym membership with some of her relatives who wanted to go to the gym to lose weight before their trip to [place] this summer. Patient is planning to do some of her exercises there.  ? ?PAIN:  ?Are you having pain? No.  ? ?OBJECTIVE ? ?TODAY'S TREATMENT:  ?Therapeutic exercise: to centralize symptoms and improve ROM, strength, muscular endurance, and activity tolerance required for successful completion of functional activities.  ?- NuStep level 5 using bilateral upper and lower extremities. Seat/handle setting 7/7. For improved extremity mobility, muscular endurance, and activity tolerance; and to induce the analgesic effect of aerobic exercise, stimulate improved joint nutrition, and prepare body structures and systems for following interventions. x 5:25 minutes. Average SPM = 80. ?- seated hamstring curl on OMEGA machine, 3x15 at 45# ?- seated knee extension on OMEGA machine, 1x10 at 15#.  ?- seated slow marching on theraball while holding blue theraband in multifidus press position. 3x1 min each side. green theraball. ?- hooklying with feet elevated, PPT with marching up to reverse curl hold briefly, 2x10 (improved control since last visit). ?- squat with buttocks touch on  18.5 inch plinth with 4#DB extended in front of body in both hands. 3 x10 ?- seated (on stool) lumbar flexion with self overpressure, 2x10 ? ?Pt required multimodal cuing for proper technique and to facilitate improved neuromuscular control, strength, range of motion, and functional ability resulting in improved performance and form. ?  ? ?PATIENT EDUCATION: ?Education details:  Exercise purpose/form. Self management techniques. Discharge recommendations.  ?Person educated: Patient ?Education method: Explanation, Demonstration, and Verbal cues ?Education comprehension: verbalized understanding, returned demonstration, and verbal cues required ? ? ?HOME EXERCISE PROGRAM: ?Access Code: [email or web address] ?URL: [email or web address] ?Date: 02/25/2022 ?Prepared by: Rosita Kea ? ?Exercises ?- Hooklying Anti-Rotation Press With Anchored Resistance  - 1 x daily - 3-5 x weekly - 3 sets - 20 reps ?- Supine Double Knee to Chest  - 2 x daily - 3-5 x weekly - 2 sets - 10 reps - 2 seconds hold ?- Seated Lumbar Flexion Stretch  - 2 x daily - 3-5 x weekly - 2 sets - 10 reps - 2 seconds hold ?- Seated Slump Nerve Glide  - 2 x daily - 1 sets - 15 reps ?- Reverse Curl  - 3-5 x weekly - 3 sets - 10 reps ? ? PT Short Term Goals   ? ?  ? PT SHORT TERM GOAL #1  ? Title Be independent with initial home exercise program for self-management of symptoms.   ? Baseline Initial HEP provided at visit 2 (01/27/2022);   ? Time 2   ? Period Weeks   ? Status Achieved   ? Target Date 02/11/22   ? ?  ?  ? ?  ? ? ? PT Long Term Goals   ?TARGET DATE FOR ALL LONG TERM GOALS: 04/21/2022 ?  ? PT LONG TERM GOAL #1  ? Title Be independent with a long-term home exercise program for self-management of symptoms   ? Baseline Initial HEP provided at IE (01/27/2022);   ? Time 12   ? Period Weeks   ? Status Achieved 03/17/2022  ?  ? PT LONG TERM GOAL #2  ? Title Reduce pain with functional activities to equal or less than 1/10 to allow patient to complete usual activities including schoolwork, sitting down, walking with less difficulty.   ? Baseline up to 7/10 (01/27/2022);up to  2/10 (03/17/2022);   ? Time 12   ? Period Weeks   ? Status Nearly met  ?  ? PT LONG TERM GOAL #3  ? Title Demonstrate improved FOTO score by 10 units to demonstrate improvement in overall condition and self-reported functional ability.   ? Baseline to be measured  visit 2 as appropriate (01/27/2022); 44 at visit#2 (02/10/2022); 42 at visit#6 (03/09/2022);  ? Time 12   ? Period Weeks   ? Status  Not met   ?  ? PT LONG TERM GOAL #4  ? Title Have full lumbar spine AROM with no compensations or increase in pain in all planes except intermittent end range discomfort improve ability to complete valued  activities with confidence such as stretching, exercising, everyday activities.   ? Baseline limited and painful - see objective exam (01/27/2022);   ? Time 12   ? Period Weeks   ? Status Not tested   ?  ? PT LONG TERM GOAL #5  ? Title Complete community, work and/or recreational activities without limitation due to current condition.   ? Baseline difficulty performing her usual activities including touching toes,  lifting L  leg farther than a certain  angle, stretching, doing "anything," everyday life, walking, sitting down, standing, hanging out with friends (01/27/2022); achieved to patient's satisfaction (03/17/2022);   ? Time 12   ? Period Weeks   ? Status Achieved 03/17/2022  ? ?  ?  ? ?  ? ? ? Plan  ? ? Clinical Impression Statement Patient has attended 7 physical therapy sessions since starting this episode of care on 01/27/2022. She has made good progress towards most goals and her condition has improved significantly. She is discharging from PT to an appropriate long term HEP today due to improvement in condition  ? Personal Factors and Comorbidities Age;Past/Current Experience;Fitness;Time since onset of injury/illness/exacerbation   ? Examination-Activity Limitations Locomotion Level;Stairs;Carry;Bend;Lift;Squat;Sit   ? Examination-Participation Restrictions Interpersonal Relationship;Occupation;Cleaning;Laundry;Community Activity   difficulty performing her usual activities including touching toes,  lifting L  leg farther than a certain angle, stretching, doing "anything," everyday life, walking, sitting down, standing, hanging out with friends  ? Stability/Clinical Decision  Making Stable/Uncomplicated   ? Rehab Potential Good   ? PT Frequency 2x / week   ? PT Duration 12 weeks   ? PT Treatment/Interventions ADLs/Self Care Home Management;Aquatic Therapy;Cryotherapy;Engineer, site
